# Patient Record
Sex: Male | Born: 1970 | Race: White | Hispanic: No | Marital: Married | Smoking: Never smoker
Health system: Southern US, Community
[De-identification: ages and names within clinical notes are randomized; demographics above are authoritative.]

---

## 2002-05-21 ENCOUNTER — Encounter: Payer: Self-pay | Admitting: Family Medicine

## 2002-05-21 ENCOUNTER — Ambulatory Visit (HOSPITAL_COMMUNITY): Admission: RE | Admit: 2002-05-21 | Discharge: 2002-05-21 | Payer: Self-pay | Admitting: Family Medicine

## 2007-09-04 ENCOUNTER — Ambulatory Visit (HOSPITAL_COMMUNITY): Admission: RE | Admit: 2007-09-04 | Discharge: 2007-09-04 | Payer: Self-pay | Admitting: Family Medicine

## 2009-07-09 ENCOUNTER — Ambulatory Visit (HOSPITAL_COMMUNITY): Admission: RE | Admit: 2009-07-09 | Discharge: 2009-07-09 | Payer: Self-pay | Admitting: Family Medicine

## 2009-08-06 ENCOUNTER — Ambulatory Visit: Payer: Self-pay | Admitting: Gastroenterology

## 2009-08-06 DIAGNOSIS — R197 Diarrhea, unspecified: Secondary | ICD-10-CM | POA: Insufficient documentation

## 2009-08-06 DIAGNOSIS — J45909 Unspecified asthma, uncomplicated: Secondary | ICD-10-CM | POA: Insufficient documentation

## 2009-08-07 ENCOUNTER — Encounter: Payer: Self-pay | Admitting: Urgent Care

## 2009-08-09 ENCOUNTER — Telehealth (INDEPENDENT_AMBULATORY_CARE_PROVIDER_SITE_OTHER): Payer: Self-pay

## 2010-07-28 NOTE — Progress Notes (Signed)
Summary: c-diff  Phone Note From Other Clinic   Caller: Katina from [facility] Summary of Call: lab called- pt is positive for c-diff. please advise Initial call taken by: Hendricks Limes LPN,  August 09, 2009 9:18 AM     Appended Document: c-diff spoke with Dr. Beatrice Lecher office- pt was started on Flagyl 250 three times a day on 07/01/09. Ov note on the 14th from their office stated he was finished with flagyl. she had no idea how many days he was on it.   Appended Document: c-diff OK, Flagyl 500mg  by mouth three times a day X 10 DAYS, #30, 0 RF If he is no better in 48 hrs,  please call us  Appended Document: c-diff LMOM  Appended Document: c-diff CDIFF pos after Abx?.1st Rx duration? Unknown if pt received adequate treatment. Agree w/ 10 days FLAGYL.   Appended Document: c-diff Pt called. JAN 7: METRONIDAZOLE, three times a day #30. Put on Prednisone JAN 14. Rx for stomach. Denies on Abx after Rx. Hasn't pick up FLAGYL yet. Call us WED or THUR if Sx not improved.  Appended Document: c-diff rx called to [facility]. pharmacist stated it was backordered but he would find some for the pt.

## 2010-07-28 NOTE — Assessment & Plan Note (Signed)
Summary: diarrhea- cdg   Visit Type:  Initial Consult Referring Provider:  Dr. Phillips Odor Primary Care Provider:  Dr. Geanie Cooley  Chief Complaint:  diarrhea and nausea/vomiting.  History of Present Illness: 40 y/o caucasian male w/ hx "always had stomach problems" but never been evaluated.  Symptoms worse approx 6 weeks ago, began to have intermittant sharp abdominal pain mid-abd, lasts less than a minute.  Pain 7/10 at worst.  Diarrhea every time he eats 5-6, w/mucous, and dark blood.  Just had hemorrhoidectomy earlier this yr.  Symptoms a little better since January.  Fever, N/V back in Jan.  Lost 18# since symptoms started.  Appetite ok.  Denies HB or indigestion.  Tried dicyclomine for diarrhea, no help.  Completed 10days flagyl no help.  On abx & prednisone in Dec/Jan for URI.  O/p negative, stool cx negative through Dr Lamar Blinks office. Been under lots of stress w/ recent separation.  No ill contacts.  Works as Engineer, maintenance.  No foreign travel.  Rare NSAIDs.   07/01/2009->CBC normal, CMP normal.    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 One By Mouth Ac/hs As Needed For Diarrhea Up To Qid 2)  Advil 200 Mg Tabs (Ibuprofen) .... Rarely  Allergies (verified): No Known Drug Allergies  Past History:  Past Medical History: asthma  Past Surgical History: Hemorrhoidectomy 2011 Dr Malvin Johns  Family History: No known family history of colorectal carcinoma, IBD, liver or chronic GI problems. 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  Alcohol Use - yes, couple/month Illicit Drug Use - no Patient gets regular exercise. Smoking Status:  never Drug Use:   no Does Patient Exercise:  yes  Review of Systems General:  Complains of fatigue, weakness, malaise, and weight loss; denies fever, chills, sweats, anorexia, and sleep disorder. CV:  Denies chest pains, angina, palpitations, syncope, dyspnea on exertion, orthopnea, PND, peripheral edema, and claudication. Resp:  Denies dyspnea at rest, dyspnea with exercise, cough, sputum, wheezing, coughing up blood, and pleurisy. GI:  Denies difficulty swallowing, pain on swallowing, indigestion/heartburn, jaundice, constipation, and fecal incontinence. GU:  Denies urinary burning, blood in urine, urinary frequency, urinary hesitancy, nocturnal urination, and urinary incontinence; occ dark. Derm:  Denies rash, itching, dry skin, hives, moles, warts, and unhealing ulcers. Psych:  Complains of depression; due to separation, not taking valium & celexa written by Dr Phillips Odor. Heme:  Denies bruising and enlarged lymph nodes.  Vital Signs:  Patient profile:   40 year old male Height:      72 inches Weight:      207 pounds BMI:     28.18 Temp:     97.9 degrees F oral Pulse rate:   68 / minute BP sitting:   120 / 82  (left arm) Cuff size:   regular  Vitals Entered By: Hendricks Limes LPN (August 06, 2009 10:49 AM)  Physical Exam  General:  Well developed, well nourished, no acute distress. Head:  Normocephalic and atraumatic. Eyes:  Sclera clear, no icterus. Ears:  Normal auditory acuity. Nose:  No deformity, discharge,  or lesions. Mouth:  No deformity or lesions, dentition normal. Neck:  Supple; no masses or thyromegaly. Lungs:  Clear throughout to auscultation. Heart:  Regular  rate and rhythm; no murmurs, rubs,  or bruits. Abdomen:  Soft, nontender and nondistended. No masses, hepatosplenomegaly or hernias noted. Normal bowel sounds.without guarding and without rebound.  without guarding.   Msk:  Symmetrical with no gross deformities. Normal posture. Pulses:  Normal pulses noted. Extremities:  No  clubbing, cyanosis, edema or deformities noted. Neurologic:  Alert and  oriented x4;  grossly normal neurologically. Skin:  Intact without significant lesions or rashes. Cervical Nodes:  No significant cervical adenopathy. Psych:  Alert and cooperative. Normal mood and affect.  Impression & Recommendations:  Problem # 1:  DIARRHEA, CHRONIC (ICD-787.91) 40 y/o caucasian male w/ lifelong hx of "stomach issues" that have been relatively mild without previous work-up, now presents w/ 6 weeks of bloody, mucousy diarrhea, abd pain, and some N/V.  Differentials include c diff colitis given recent abx use, e coli, inflammatory bowel disease, irritable bowel syndrome and least likely colorectal carcinoma.    Check ecoli & cdiff.  If negative, proceed w/ colonoscopy.  Diagnostic colonoscopy to be performed by Dr. Jonette Eva in the near future.  I have discussed risks and benefits which include, but are not limited to, bleeding, infection, perforation, or medication reaction.  The patient agrees with this plan and consent will be obtained.  Orders: Consultation Level III [phone])  Patient Instructions: 1)  Return stool studies as soon as possible to lab 2)  Plenty of fluids 3)  If severe pain, vomiting, fever, to ER 4)  Try levsin as directed 5)  Begin Sustenex once daily 6)  The medication list was reviewed and reconciled.  All changed / newly prescribed medications were explained.  A complete medication list was provided to the patient / caregiver. Prescriptions: LEVSIN/SL 0.125 MG SUBL (HYOSCYAMINE SULFATE) one by mouth ac/hs as needed for diarrhea up to QID  #90 x 0   Entered and Authorized by:   Joselyn Arrow FNP-BC   Signed by:   Joselyn Arrow FNP-BC on 08/06/2009   Method used:   Electronically to        [facility] [phone]* (retail)       [address]       [place], [place]  [address]       Ph: [phone] or [phone]       Fax: [phone]   RxID:    [id number]   Appended Document: diarrhea- cdg DEC 2010: 217 LBS JAN 2011: 210 lbs, O&P NEG, STOOL CX NEG, HB 15.7 PLT 263 CR 1.06 NL HFP ALB 4.7

## 2011-10-08 ENCOUNTER — Encounter (HOSPITAL_COMMUNITY): Payer: Self-pay

## 2011-10-08 ENCOUNTER — Emergency Department (HOSPITAL_COMMUNITY)
Admission: EM | Admit: 2011-10-08 | Discharge: 2011-10-08 | Disposition: A | Discharge status: Home / Self Care | Payer: Self-pay | Source: Home / Self Care

## 2011-10-08 DIAGNOSIS — T1500XA Foreign body in cornea, unspecified eye, initial encounter: Secondary | ICD-10-CM

## 2011-10-08 MED ORDER — TOBRAMYCIN 0.3 % OP SOLN: 2.0000 [drp] | OPHTHALMIC | Status: AC

## 2011-10-08 MED ORDER — HYDROCODONE-ACETAMINOPHEN 5-325 MG PO TABS: 1.0000 | ORAL_TABLET | Freq: Four times a day (QID) | ORAL | Status: AC | PRN

## 2011-10-08 MED ORDER — TETRACAINE HCL 0.5 % OP SOLN
OPHTHALMIC | Status: AC
  Filled 2011-10-08: qty 2
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
## 2011-10-08 NOTE — Discharge Instructions (Signed)
You still have a rust ring from the foreign body. This will need to be evaluated and removed by an eye specialist (opthamologist).  You have an appt tomorrow morning at 9 am. See above. Use antibiotic eye drops as prescribed. May also use Sustain eye drops as a lubricant for discomfort. Do not drive or operate machinery while taking pain medication.  Corneal Foreign Body A corneal foreign body is an injury from material in your eye. This foreign body became stuck in (lodged) in the clear layer that covers the front part of the eye. Specks of metal, sand or wood commonly cause this injury. Using a local anesthetic, your caregiver removed the foreign body in your cornea. This local anesthetic is a medication that makes the cornea numb. Your eye will be painful when the local anesthetic wears off. Blinking the eye increases pain, so sometimes a patch is applied to eliminate this. The more you rest your "good eye", the better both eyes will feel. HOME CARE INSTRUCTIONS   The use of eye patches is not universal and their use varies from state to state and from caregiver to caregiver. If eye patch was applied:   Keep your eye patch on for as long as directed by your caregiver until your follow-up appointment.   Do NOT remove the patch unless instructed to do so to put in medications; then replace patch and re-tape it as it was before. Follow the same procedure if the patch becomes loose.   WARNING: Do not drive or operate machinery while your eye is patched. Your ability to judge distances is impaired.   If no eye patch was applied:   Keep your eye closed as much as possible. Do not rub your eye.   Wear dark glasses for as long as directed by your caregiver to protect your eyes from bright light.   Do not wear contact lenses for as long as directed by your caregiver.   Wear protective eye covering if your job or hobby involves the risk of eye injury. This is especially important when working with  high speed tools.   Only take over-the-counter or prescription medicines for pain, discomfort, or fever as directed by your caregiver.  SEEK IMMEDIATE MEDICAL CARE IF:   Pain increases in your eye or your vision changes.   You have problems with your eye patch.   The injury to your eye appears to be getting larger.   You develop any kind of discharge from the injured eye.   Swelling and/or soreness (inflammation) develops around the affected eye.   An oral temperature above 102 F (38.9 C) develops.  MAKE SURE YOU:   Understand these instructions.   Will watch your condition.   Will get help right away if you are not doing well or get worse.  Document Released: 06/09/2000 Document Revised: 06/01/2011 Document Reviewed: 01/29/2008 [facility] Patient Information 2012 [place], [place].
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
## 2011-10-16 NOTE — ED Provider Notes (Signed)
I saw and evaluated the pt. Pt has small rust ring s/p FB removal. EOM, no other FB seen, Cornea intact on my slit lamp exam. Agree with PA's note and plan.   Domenick Gong, MD  Luiz Blare, MD 10/16/11 [phone]

## 2019-09-18 ENCOUNTER — Ambulatory Visit: Payer: Self-pay | Attending: Internal Medicine

## 2019-09-18 DIAGNOSIS — Z23 Encounter for immunization: Secondary | ICD-10-CM

## 2019-09-18 NOTE — Progress Notes (Signed)
   Covid-19 Vaccination Clinic  Name:  [name]    MRN: [id number] DOB: 01/30/1971  09/18/2019  Mr. Jorge Conley was observed post Covid-19 immunization for 15 minutes without incident. He was provided with Vaccine Information Sheet and instruction to access the V-Safe system.   Mr. Jorge Conley was instructed to call 911 with any severe reactions post vaccine: Marland Kitchen Difficulty breathing  . Swelling of face and throat  . A fast heartbeat  . A bad rash all over body  . Dizziness and weakness   Immunizations Administered    Name Date Dose VIS Date Route   Pfizer COVID-19 Vaccine 09/18/2019  2:41 PM 0.3 mL 06/06/2019 Intramuscular   Manufacturer: [employer], [employer]   Lot: [id number]   NDC: [address]-9

## 2019-10-14 ENCOUNTER — Ambulatory Visit: Payer: Self-pay | Attending: Internal Medicine

## 2019-10-14 DIAGNOSIS — Z23 Encounter for immunization: Secondary | ICD-10-CM

## 2019-10-14 NOTE — Progress Notes (Signed)
   Covid-19 Vaccination Clinic  Name:  Jorge Conley    MRN: [id number] DOB: [birth year]  10/14/2019  [name] was observed post Covid-19 immunization for 15 minutes without incident. He was provided with Vaccine Information Sheet and instruction to access the V-Safe system.   [name] was instructed to call 911 with any severe reactions post vaccine: Marland Kitchen Difficulty breathing  . Swelling of face and throat  . A fast heartbeat  . A bad rash all over body  . Dizziness and weakness   Immunizations Administered    Name Date Dose VIS Date Route   Pfizer COVID-19 Vaccine 10/14/2019  2:23 PM 0.3 mL 08/20/2018 Intramuscular   Manufacturer: [employer], [employer]   Lot: [id number]   NDC: [address]-3

## 2021-06-28 DIAGNOSIS — N400 Enlarged prostate without lower urinary tract symptoms: Secondary | ICD-10-CM | POA: Diagnosis not present

## 2021-06-28 DIAGNOSIS — J329 Chronic sinusitis, unspecified: Secondary | ICD-10-CM | POA: Diagnosis not present

## 2021-06-28 DIAGNOSIS — Z6833 Body mass index (BMI) 33.0-33.9, adult: Secondary | ICD-10-CM | POA: Diagnosis not present

## 2021-06-28 DIAGNOSIS — R030 Elevated blood-pressure reading, without diagnosis of hypertension: Secondary | ICD-10-CM | POA: Diagnosis not present

## 2021-06-28 DIAGNOSIS — E7849 Other hyperlipidemia: Secondary | ICD-10-CM | POA: Diagnosis not present

## 2021-06-28 DIAGNOSIS — E6609 Other obesity due to excess calories: Secondary | ICD-10-CM | POA: Diagnosis not present

## 2021-06-28 DIAGNOSIS — E782 Mixed hyperlipidemia: Secondary | ICD-10-CM | POA: Diagnosis not present

## 2021-10-11 ENCOUNTER — Other Ambulatory Visit (HOSPITAL_COMMUNITY): Payer: Self-pay | Admitting: Family Medicine

## 2021-10-11 ENCOUNTER — Ambulatory Visit (HOSPITAL_COMMUNITY)
Admission: RE | Admit: 2021-10-11 | Discharge: 2021-10-11 | Disposition: A | Discharge status: Home / Self Care | Payer: BC Managed Care – PPO | Source: Ambulatory Visit | Attending: Family Medicine | Admitting: Family Medicine

## 2021-10-11 DIAGNOSIS — R059 Cough, unspecified: Secondary | ICD-10-CM | POA: Diagnosis not present

## 2021-10-11 DIAGNOSIS — Z6834 Body mass index (BMI) 34.0-34.9, adult: Secondary | ICD-10-CM | POA: Diagnosis not present

## 2021-10-11 DIAGNOSIS — J40 Bronchitis, not specified as acute or chronic: Secondary | ICD-10-CM

## 2021-10-11 DIAGNOSIS — E6609 Other obesity due to excess calories: Secondary | ICD-10-CM | POA: Diagnosis not present

## 2021-12-06 ENCOUNTER — Other Ambulatory Visit: Payer: Self-pay | Admitting: Family Medicine

## 2021-12-06 ENCOUNTER — Other Ambulatory Visit (HOSPITAL_COMMUNITY): Payer: Self-pay | Admitting: Family Medicine

## 2021-12-06 DIAGNOSIS — Z6834 Body mass index (BMI) 34.0-34.9, adult: Secondary | ICD-10-CM | POA: Diagnosis not present

## 2021-12-06 DIAGNOSIS — R221 Localized swelling, mass and lump, neck: Secondary | ICD-10-CM

## 2021-12-06 DIAGNOSIS — E6609 Other obesity due to excess calories: Secondary | ICD-10-CM | POA: Diagnosis not present

## 2021-12-06 DIAGNOSIS — S76212A Strain of adductor muscle, fascia and tendon of left thigh, initial encounter: Secondary | ICD-10-CM | POA: Diagnosis not present

## 2021-12-14 ENCOUNTER — Ambulatory Visit (HOSPITAL_COMMUNITY)
Admission: RE | Admit: 2021-12-14 | Discharge: 2021-12-14 | Disposition: A | Discharge status: Home / Self Care | Payer: BC Managed Care – PPO | Source: Ambulatory Visit | Attending: Family Medicine | Admitting: Family Medicine

## 2021-12-14 DIAGNOSIS — R221 Localized swelling, mass and lump, neck: Secondary | ICD-10-CM | POA: Diagnosis not present

## 2021-12-14 DIAGNOSIS — R590 Localized enlarged lymph nodes: Secondary | ICD-10-CM | POA: Diagnosis not present

## 2022-06-05 DIAGNOSIS — H538 Other visual disturbances: Secondary | ICD-10-CM | POA: Diagnosis not present

## 2022-06-05 DIAGNOSIS — K219 Gastro-esophageal reflux disease without esophagitis: Secondary | ICD-10-CM | POA: Diagnosis not present

## 2022-06-05 DIAGNOSIS — E6609 Other obesity due to excess calories: Secondary | ICD-10-CM | POA: Diagnosis not present

## 2022-06-05 DIAGNOSIS — R519 Headache, unspecified: Secondary | ICD-10-CM | POA: Diagnosis not present

## 2022-06-05 DIAGNOSIS — Z6836 Body mass index (BMI) 36.0-36.9, adult: Secondary | ICD-10-CM | POA: Diagnosis not present

## 2022-06-06 ENCOUNTER — Encounter: Payer: Self-pay | Admitting: Internal Medicine

## 2022-06-21 DIAGNOSIS — R519 Headache, unspecified: Secondary | ICD-10-CM | POA: Diagnosis not present

## 2022-06-21 DIAGNOSIS — H538 Other visual disturbances: Secondary | ICD-10-CM | POA: Diagnosis not present

## 2022-06-21 DIAGNOSIS — R42 Dizziness and giddiness: Secondary | ICD-10-CM | POA: Diagnosis not present

## 2022-07-03 DIAGNOSIS — F32A Depression, unspecified: Secondary | ICD-10-CM | POA: Diagnosis not present

## 2022-07-05 DIAGNOSIS — F32A Depression, unspecified: Secondary | ICD-10-CM | POA: Diagnosis not present

## 2022-07-10 DIAGNOSIS — F32A Depression, unspecified: Secondary | ICD-10-CM | POA: Diagnosis not present

## 2022-07-13 DIAGNOSIS — L820 Inflamed seborrheic keratosis: Secondary | ICD-10-CM | POA: Diagnosis not present

## 2022-07-13 DIAGNOSIS — L720 Epidermal cyst: Secondary | ICD-10-CM | POA: Diagnosis not present

## 2022-07-13 DIAGNOSIS — L918 Other hypertrophic disorders of the skin: Secondary | ICD-10-CM | POA: Diagnosis not present

## 2022-07-19 ENCOUNTER — Ambulatory Visit: Payer: BC Managed Care – PPO | Admitting: Gastroenterology

## 2022-07-24 DIAGNOSIS — F32A Depression, unspecified: Secondary | ICD-10-CM | POA: Diagnosis not present

## 2022-07-27 DIAGNOSIS — Z570 Occupational exposure to noise: Secondary | ICD-10-CM | POA: Diagnosis not present

## 2022-07-27 DIAGNOSIS — J392 Other diseases of pharynx: Secondary | ICD-10-CM | POA: Diagnosis not present

## 2022-07-27 DIAGNOSIS — R519 Headache, unspecified: Secondary | ICD-10-CM | POA: Diagnosis not present

## 2022-07-27 DIAGNOSIS — H9313 Tinnitus, bilateral: Secondary | ICD-10-CM | POA: Diagnosis not present

## 2022-08-21 DIAGNOSIS — F32A Depression, unspecified: Secondary | ICD-10-CM | POA: Diagnosis not present

## 2023-02-22 DIAGNOSIS — E6609 Other obesity due to excess calories: Secondary | ICD-10-CM | POA: Diagnosis not present

## 2023-02-22 DIAGNOSIS — E7849 Other hyperlipidemia: Secondary | ICD-10-CM | POA: Diagnosis not present

## 2023-02-22 DIAGNOSIS — Z1331 Encounter for screening for depression: Secondary | ICD-10-CM | POA: Diagnosis not present

## 2023-02-22 DIAGNOSIS — Z6835 Body mass index (BMI) 35.0-35.9, adult: Secondary | ICD-10-CM | POA: Diagnosis not present

## 2023-02-22 DIAGNOSIS — E782 Mixed hyperlipidemia: Secondary | ICD-10-CM | POA: Diagnosis not present

## 2023-02-22 DIAGNOSIS — Z0001 Encounter for general adult medical examination with abnormal findings: Secondary | ICD-10-CM | POA: Diagnosis not present

## 2023-02-22 DIAGNOSIS — N400 Enlarged prostate without lower urinary tract symptoms: Secondary | ICD-10-CM | POA: Diagnosis not present

## 2023-05-11 DIAGNOSIS — E6609 Other obesity due to excess calories: Secondary | ICD-10-CM | POA: Diagnosis not present

## 2023-05-11 DIAGNOSIS — M5442 Lumbago with sciatica, left side: Secondary | ICD-10-CM | POA: Diagnosis not present

## 2023-05-11 DIAGNOSIS — Z6834 Body mass index (BMI) 34.0-34.9, adult: Secondary | ICD-10-CM | POA: Diagnosis not present

## 2023-07-29 IMAGING — US US SOFT TISSUE HEAD/NECK
1 series · 14 of 16 positions shown · non-contrast
Comparison: None Available.

CLINICAL DATA: Concern for neck swelling in the supraclavicular
regions

EXAM:
ULTRASOUND OF HEAD/NECK SOFT TISSUES
TECHNIQUE: Ultrasound examination of the head and neck soft tissues was
performed in the area of clinical concern.

[Series 1: us soft tissue head & neck (non-thyroid) · 14 of 16 slices shown]
[im 1/16]
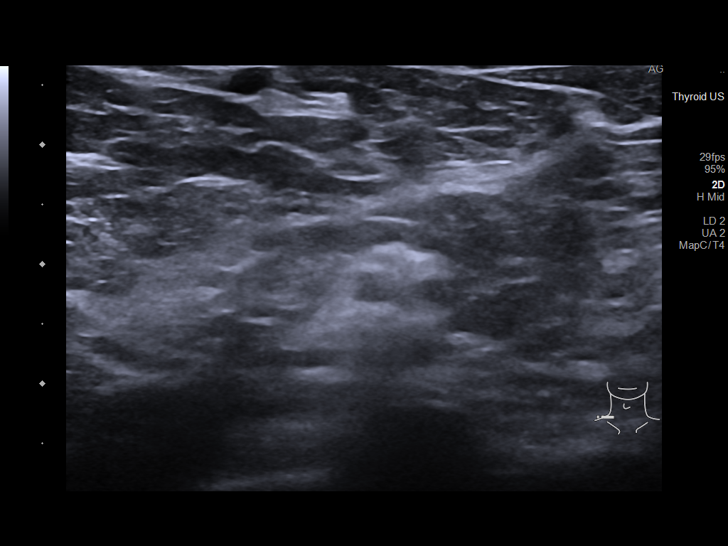
[im 2/16]
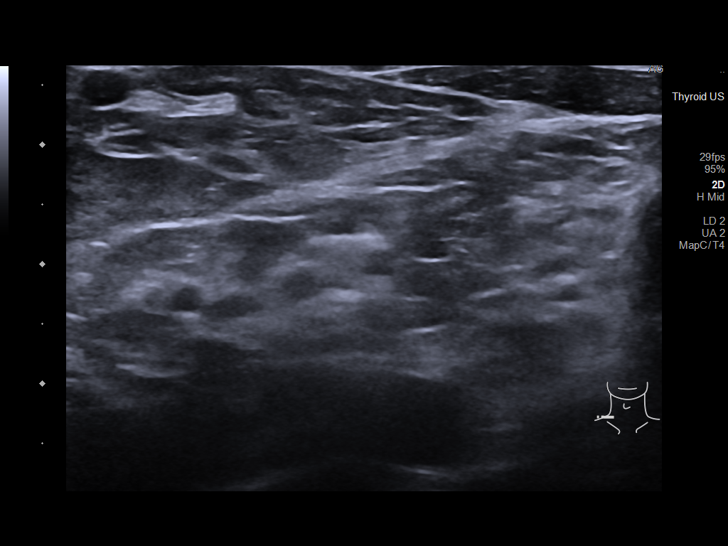
[im 3/16]
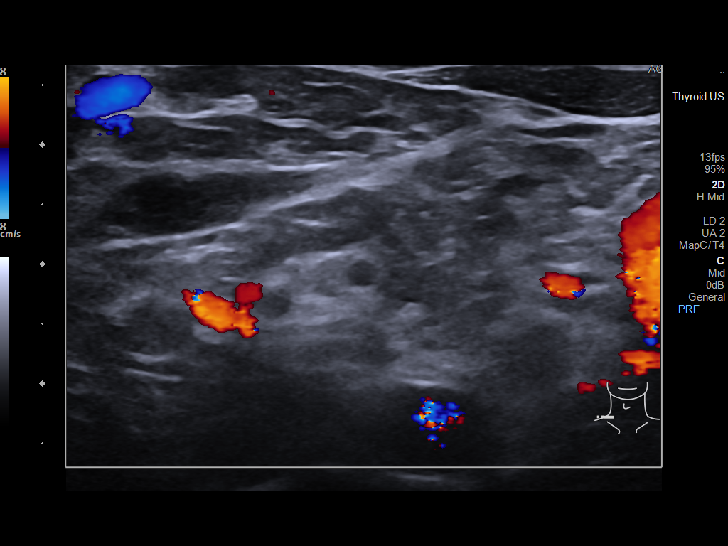
[im 5/16]
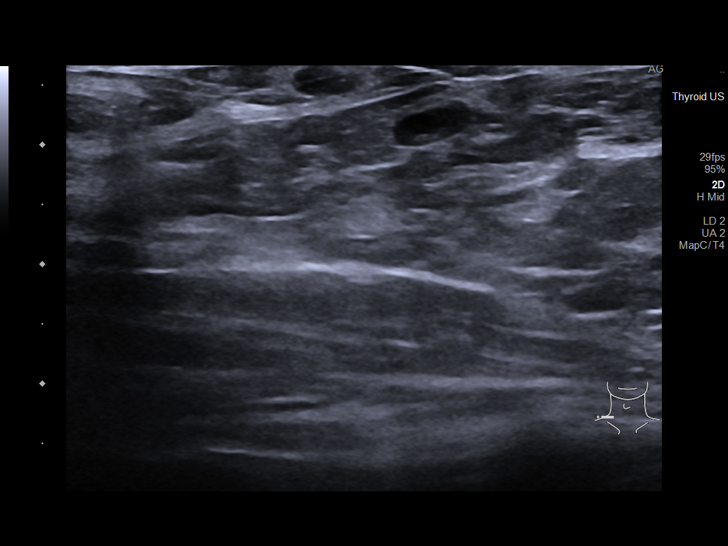
[im 6/16]
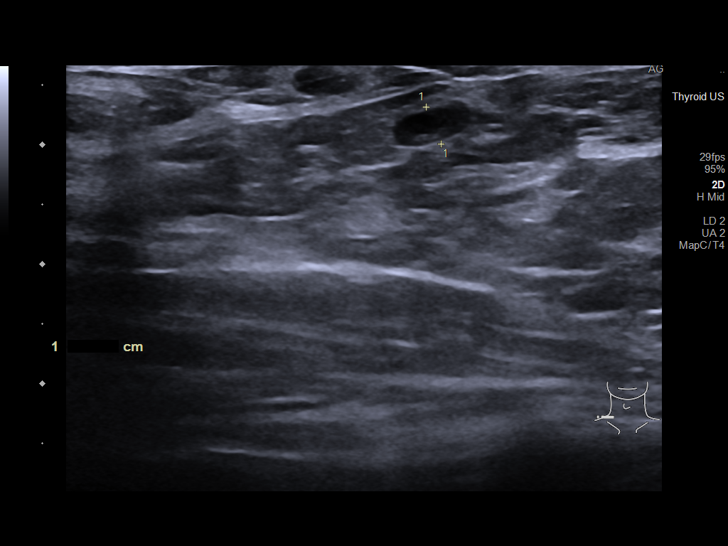
[im 7/16]
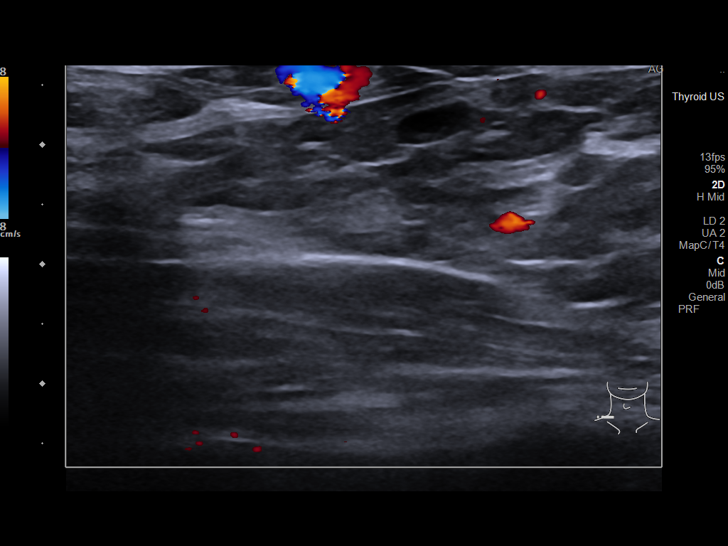
[im 8/16]
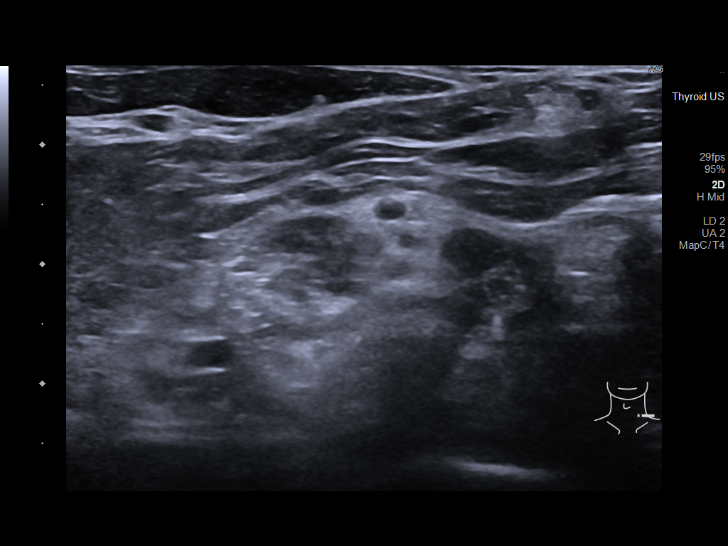
[im 9/16]
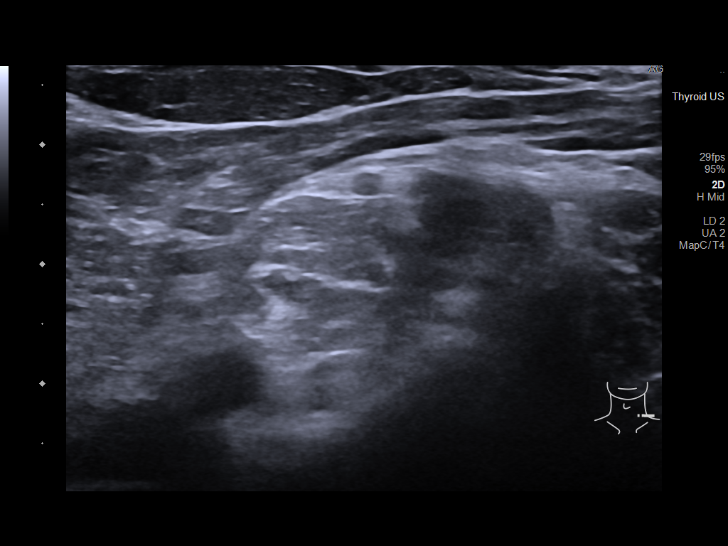
[im 10/16]
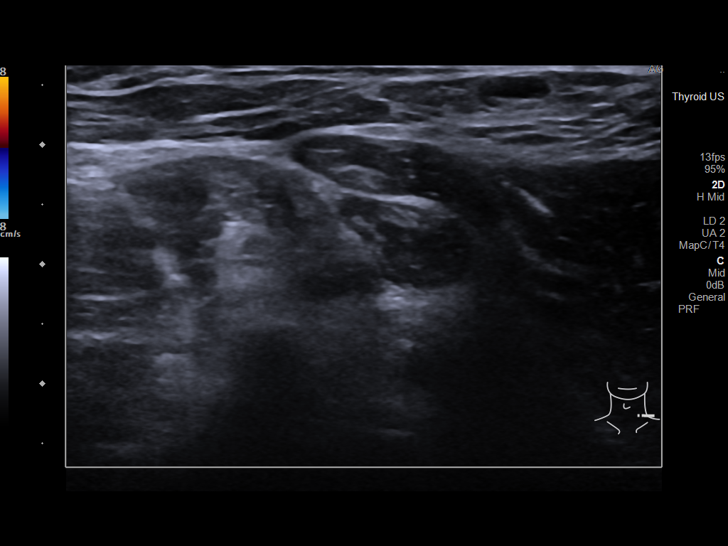
[im 11/16]
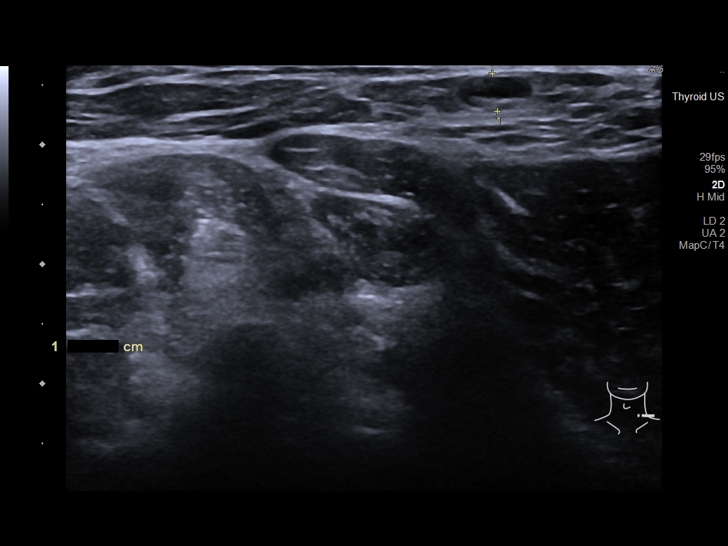
[im 13/16]
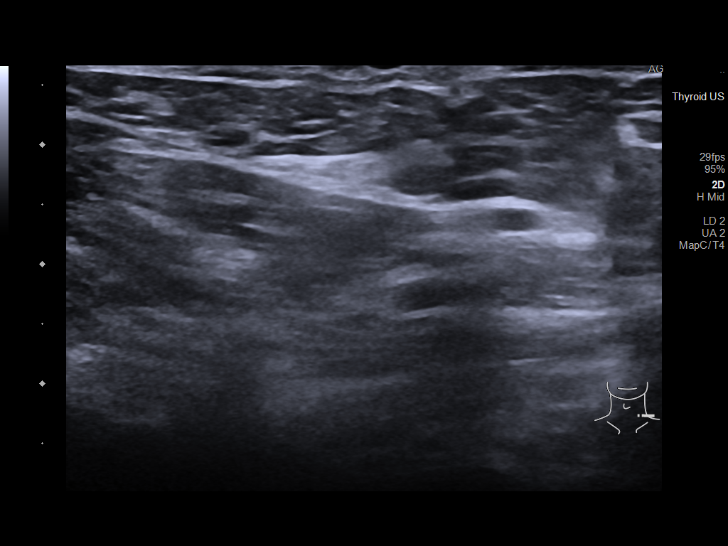
[im 14/16]
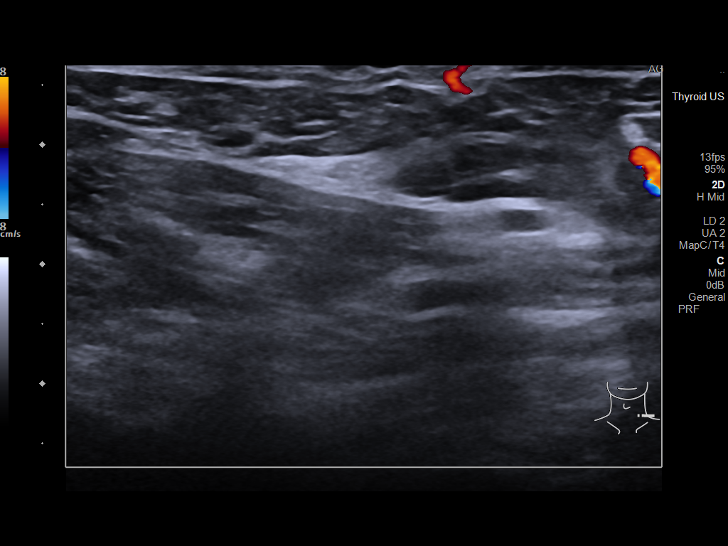
[im 15/16]
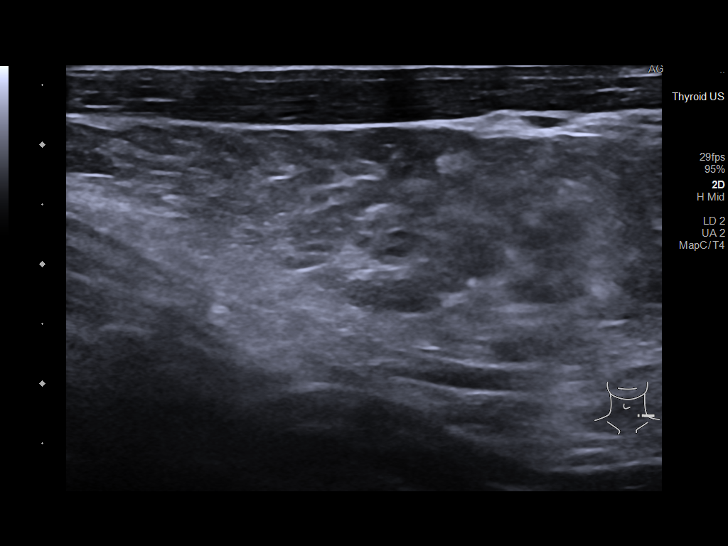
[im 16/16]
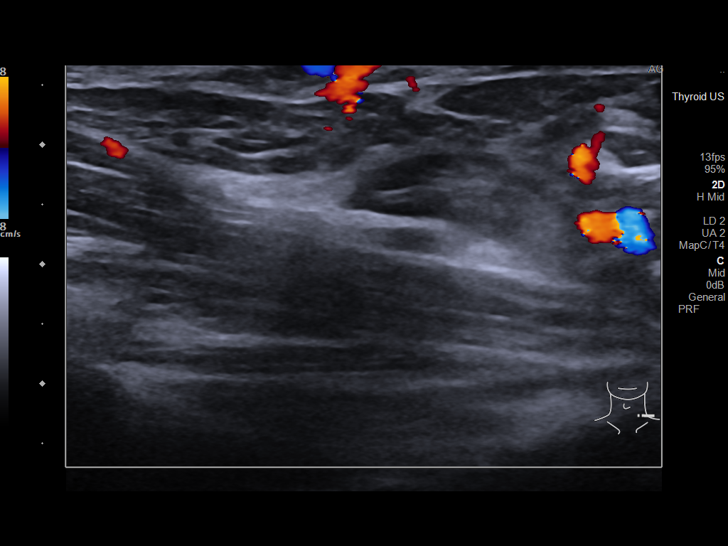

[14 of 16 positions shown; findings below may reference images not displayed]

FINDINGS: Ultrasound performed of the supraclavicular areas of concern
bilaterally. No underlying soft tissue mass, cyst, fluid collection,
hemorrhage, hematoma, abscess, or bulky adenopathy. Small
subcentimeter benign-appearing lymph nodes noted bilaterally.
IMPRESSION: No significant supraclavicular superficial soft tissue abnormality
by ultrasound.

## 2023-09-04 DIAGNOSIS — Z6834 Body mass index (BMI) 34.0-34.9, adult: Secondary | ICD-10-CM | POA: Diagnosis not present

## 2023-09-04 DIAGNOSIS — M545 Low back pain, unspecified: Secondary | ICD-10-CM | POA: Diagnosis not present

## 2023-10-16 DIAGNOSIS — M47816 Spondylosis without myelopathy or radiculopathy, lumbar region: Secondary | ICD-10-CM | POA: Diagnosis not present

## 2023-10-17 ENCOUNTER — Other Ambulatory Visit: Payer: Self-pay | Admitting: Student in an Organized Health Care Education/Training Program

## 2023-10-17 DIAGNOSIS — M47816 Spondylosis without myelopathy or radiculopathy, lumbar region: Secondary | ICD-10-CM

## 2024-03-24 DIAGNOSIS — E291 Testicular hypofunction: Secondary | ICD-10-CM | POA: Diagnosis not present

## 2024-03-24 DIAGNOSIS — E7849 Other hyperlipidemia: Secondary | ICD-10-CM | POA: Diagnosis not present

## 2024-03-24 DIAGNOSIS — N400 Enlarged prostate without lower urinary tract symptoms: Secondary | ICD-10-CM | POA: Diagnosis not present

## 2024-03-24 DIAGNOSIS — Z125 Encounter for screening for malignant neoplasm of prostate: Secondary | ICD-10-CM | POA: Diagnosis not present

## 2024-03-24 DIAGNOSIS — Z6835 Body mass index (BMI) 35.0-35.9, adult: Secondary | ICD-10-CM | POA: Diagnosis not present

## 2024-03-24 DIAGNOSIS — Z0001 Encounter for general adult medical examination with abnormal findings: Secondary | ICD-10-CM | POA: Diagnosis not present

## 2024-03-24 DIAGNOSIS — R030 Elevated blood-pressure reading, without diagnosis of hypertension: Secondary | ICD-10-CM | POA: Diagnosis not present

## 2024-03-24 DIAGNOSIS — K219 Gastro-esophageal reflux disease without esophagitis: Secondary | ICD-10-CM | POA: Diagnosis not present
